# Patient Record
Sex: Male | Born: 1945 | Race: Black or African American | Hispanic: No | Marital: Married | Smoking: Former smoker
Health system: Southern US, Community
[De-identification: ages and names within clinical notes are randomized; demographics above are authoritative.]

## PROBLEM LIST (undated history)

## (undated) DIAGNOSIS — E119 Type 2 diabetes mellitus without complications: Secondary | ICD-10-CM

## (undated) DIAGNOSIS — E7800 Pure hypercholesterolemia, unspecified: Secondary | ICD-10-CM

## (undated) DIAGNOSIS — I10 Essential (primary) hypertension: Secondary | ICD-10-CM

## (undated) DIAGNOSIS — N289 Disorder of kidney and ureter, unspecified: Secondary | ICD-10-CM

## (undated) HISTORY — PX: PENILE PROSTHESIS IMPLANT: SHX240

## (undated) HISTORY — PX: TONSILLECTOMY: SUR1361

---

## 2015-09-25 ENCOUNTER — Encounter (HOSPITAL_COMMUNITY): Payer: Self-pay

## 2015-09-25 ENCOUNTER — Emergency Department (HOSPITAL_COMMUNITY)
Admission: EM | Admit: 2015-09-25 | Discharge: 2015-09-25 | Disposition: A | Discharge status: Home / Self Care | Payer: Medicare Other | Attending: Emergency Medicine | Admitting: Emergency Medicine

## 2015-09-25 DIAGNOSIS — D649 Anemia, unspecified: Secondary | ICD-10-CM | POA: Insufficient documentation

## 2015-09-25 DIAGNOSIS — R300 Dysuria: Secondary | ICD-10-CM | POA: Diagnosis present

## 2015-09-25 DIAGNOSIS — E119 Type 2 diabetes mellitus without complications: Secondary | ICD-10-CM | POA: Insufficient documentation

## 2015-09-25 DIAGNOSIS — I120 Hypertensive chronic kidney disease with stage 5 chronic kidney disease or end stage renal disease: Secondary | ICD-10-CM | POA: Diagnosis not present

## 2015-09-25 DIAGNOSIS — N186 End stage renal disease: Secondary | ICD-10-CM | POA: Insufficient documentation

## 2015-09-25 DIAGNOSIS — Z992 Dependence on renal dialysis: Secondary | ICD-10-CM | POA: Diagnosis not present

## 2015-09-25 HISTORY — DX: Essential (primary) hypertension: I10

## 2015-09-25 HISTORY — DX: Type 2 diabetes mellitus without complications: E11.9

## 2015-09-25 HISTORY — DX: Pure hypercholesterolemia, unspecified: E78.00

## 2015-09-25 HISTORY — DX: Disorder of kidney and ureter, unspecified: N28.9

## 2015-09-25 LAB — URINALYSIS, ROUTINE W REFLEX MICROSCOPIC
Bilirubin Urine: NEGATIVE
GLUCOSE, UA: NEGATIVE mg/dL
KETONES UR: NEGATIVE mg/dL
LEUKOCYTES UA: NEGATIVE
NITRITE: NEGATIVE
Protein, ur: >300 mg/dL — AB
Specific Gravity, Urine: 1.022 (ref 1.005–1.030)
pH: 5.5 (ref 5.0–8.0)

## 2015-09-25 LAB — CBC
HEMATOCRIT: 31.8 % — AB (ref 39.0–52.0)
HEMOGLOBIN: 10.4 g/dL — AB (ref 13.0–17.0)
MCH: 28 pg (ref 26.0–34.0)
MCHC: 32.7 g/dL (ref 30.0–36.0)
MCV: 85.5 fL (ref 78.0–100.0)
Platelets: 288 10*3/uL (ref 150–400)
RBC: 3.72 MIL/uL — ABNORMAL LOW (ref 4.22–5.81)
RDW: 13 % (ref 11.5–15.5)
WBC: 6.9 10*3/uL (ref 4.0–10.5)

## 2015-09-25 LAB — BASIC METABOLIC PANEL
ANION GAP: 14 (ref 5–15)
BUN: 68 mg/dL — ABNORMAL HIGH (ref 6–20)
CALCIUM: 9.5 mg/dL (ref 8.9–10.3)
CO2: 23 mmol/L (ref 22–32)
CREATININE: 7.95 mg/dL — AB (ref 0.61–1.24)
Chloride: 99 mmol/L — ABNORMAL LOW (ref 101–111)
GFR, EST AFRICAN AMERICAN: 7 mL/min — AB (ref >60)
GFR, EST NON AFRICAN AMERICAN: 6 mL/min — AB (ref >60)
Glucose, Bld: 259 mg/dL — ABNORMAL HIGH (ref 65–99)
Potassium: 4.3 mmol/L (ref 3.5–5.1)
SODIUM: 136 mmol/L (ref 135–145)

## 2015-09-25 LAB — URINE MICROSCOPIC-ADD ON

## 2015-09-25 NOTE — ED Notes (Signed)
Patient here with ongoing dysuria for the past week, has taken full week of Cipro with no relief, no other associated symptoms

## 2015-09-25 NOTE — Discharge Instructions (Signed)
Return to the ED with any concerns including fever/chills, vomiting and not able to keep down liquids, not able to urinate, fainting, decreased level of alertness/lethargy, or any other alarming symptoms

## 2015-09-25 NOTE — ED Notes (Signed)
Bladder scanned pt, maximum result: 165 mL, informed Carla-RN.

## 2015-09-25 NOTE — ED Provider Notes (Signed)
CSN: [id number]     Arrival date & time 09/25/15  1237 History   First MD Initiated Contact with Patient 09/25/15 1639     Chief Complaint  Patient presents with  . Dysuria     (Consider location/radiation/quality/duration/timing/severity/associated sxs/prior Treatment) HPI  Pt presenting with c/o dysuria.  He describes pain with urinating, as well as pain in his lower abdomen.  He states the pain improves after his urine stream starts.  He is from [place]- was started on cipro one week ago for presumed urine infection- he states he finished the medication for week course and has not seen much improvement in the pain with urination.  No fever/chills.  Does not feel it is difficult to pass urine.  No blood in urine.  No flank pain.  Feels like a spasm of his bladder when he urinates.  No penile discharge.  Wife also states he has been more tired than usual.  They are traveling currently.  There are no other associated systemic symptoms, there are no other alleviating or modifying factors.   Past Medical History  Diagnosis Date  . Diabetes mellitus without complication (HCC)   . Hypertension   . Renal disorder   . High cholesterol    History reviewed. No pertinent past surgical history. No family history on file. Social History  Substance Use Topics  . Smoking status: Never Smoker   . Smokeless tobacco: None  . Alcohol Use: None    Review of Systems  ROS reviewed and all otherwise negative except for mentioned in HPI    Allergies  Aleve; Allopurinol; Clonidine derivatives; and Tylenol with codeine #3  Home Medications   Prior to Admission medications   Not on File   BP 153/70 mmHg  Pulse 71  Temp(Src) 99.2 F (37.3 C) (Oral)  Resp 22  Ht  (1.803 m)  Wt 100.699 kg  BMI 30.98 kg/m2  SpO2 100%  Vitals reviewed Physical Exam  Physical Examination: General appearance - alert, well appearing, and in no distress Mental status - alert, oriented to person, place, and  time Eyes - no conjunctival injection no scleral icterus Chest - clear to auscultation, no wheezes, rales or rhonchi, symmetric air entry Heart - normal rate, regular rhythm, normal S1, S2, no murmurs, rubs, clicks or gallops Abdomen - soft, nontender, nondistended, no masses or organomegaly Neurological - alert, oriented, normal speech Extremities - peripheral pulses normal, no pedal edema, no clubbing or cyanosis Skin - normal coloration and turgor, no rashes  ED Course  Procedures (including critical care time) Labs Review Labs Reviewed  URINALYSIS, ROUTINE W REFLEX MICROSCOPIC (NOT AT [facility]) - Abnormal; Notable for the following:    Hgb urine dipstick MODERATE (*)    Protein, ur >300 (*)    All other components within normal limits  URINE MICROSCOPIC-ADD ON - Abnormal; Notable for the following:    Squamous Epithelial / LPF 0-5 (*)    Bacteria, UA FEW (*)    Casts GRANULAR CAST (*)    All other components within normal limits  CBC - Abnormal; Notable for the following:    RBC 3.72 (*)    Hemoglobin 10.4 (*)    HCT 31.8 (*)    All other components within normal limits  BASIC METABOLIC PANEL - Abnormal; Notable for the following:    Chloride 99 (*)    Glucose, Bld 259 (*)    BUN 68 (*)    Creatinine, Ser 7.95 (*)    GFR calc  non Af Amer 6 (*)    GFR calc Af Amer 7 (*)    All other components within normal limits    Imaging Review No results found. I have personally reviewed and evaluated these images and lab results as part of my medical decision-making.   EKG Interpretation None      MDM   Final diagnoses:  Dysuria  End stage renal disease on dialysis (HCC)  Anemia, unspecified anemia type    Pt presenting with c/o dysuria for the past week- has finished a course of cipro- urine is reassuring today in the ED.  Pt is on peritoneal dialysis- labs are reassuring and near his baseline.  Pt has a hx of anemia.  Bladder scan performed and does not show signs of  urinary retention.  Discharged with strict return precautions.  Pt agreeable with plan.    Jerelyn ScottMartha Linker, MD 09/25/15 [phone]

## 2015-09-26 ENCOUNTER — Encounter (HOSPITAL_COMMUNITY): Payer: Self-pay | Admitting: *Deleted

## 2015-09-26 ENCOUNTER — Ambulatory Visit (HOSPITAL_COMMUNITY)
Admission: EM | Admit: 2015-09-26 | Discharge: 2015-09-26 | Disposition: A | Discharge status: Home / Self Care | Payer: Medicare Other | Attending: Family Medicine | Admitting: Family Medicine

## 2015-09-26 DIAGNOSIS — I10 Essential (primary) hypertension: Secondary | ICD-10-CM | POA: Insufficient documentation

## 2015-09-26 DIAGNOSIS — E7800 Pure hypercholesterolemia, unspecified: Secondary | ICD-10-CM | POA: Diagnosis not present

## 2015-09-26 DIAGNOSIS — R300 Dysuria: Secondary | ICD-10-CM | POA: Insufficient documentation

## 2015-09-26 DIAGNOSIS — Z8249 Family history of ischemic heart disease and other diseases of the circulatory system: Secondary | ICD-10-CM | POA: Diagnosis not present

## 2015-09-26 DIAGNOSIS — E119 Type 2 diabetes mellitus without complications: Secondary | ICD-10-CM | POA: Diagnosis not present

## 2015-09-26 DIAGNOSIS — R109 Unspecified abdominal pain: Secondary | ICD-10-CM | POA: Diagnosis present

## 2015-09-26 MED ORDER — PHENAZOPYRIDINE HCL 200 MG PO TABS: 200.0000 mg | ORAL_TABLET | Freq: Three times a day (TID) | ORAL | Status: AC | PRN

## 2015-09-26 NOTE — ED Notes (Signed)
Pt  Has  Peritoneal  Dialysis       He  Was    [facility]  [facility]    [facility]       He   Recently        Completed   A  Course  On  Anti  Biotics            he  States  Unable  To  See  A  Urologist  Until  Next month

## 2015-09-26 NOTE — ED Provider Notes (Signed)
CSN: [id number]     Arrival date & time 09/26/15  1522 History   First MD Initiated Contact with Patient 09/26/15 1551     Chief Complaint  Patient presents with  . Abdominal Pain   (Consider location/radiation/quality/duration/timing/severity/associated sxs/prior Treatment) HPI History obtained from patient:  Pt presents with the cc of:  Urethral pain, dysuria Duration of symptoms: Almost 2 weeks Treatment prior to arrival: Currently taking ciprofloxacin for UTI, was seen in the emergency department yesterday no infection but no medication for spasm. Context: Patient is visiting the area from [facility], was being treated for urinary tract infection with ciprofloxacin. He has had dysuria most of the days that he has had an infection. He has not had any relief from increased fluids. Denies any fever at this time. Denies any blood in his urine. Other symptoms include: No back pain. Pain score: 3 but only when about to urinate. Once flow starts dysuria is resolved until the next time. FAMILY HISTORY: Hypertension    Past Medical History  Diagnosis Date  . Diabetes mellitus without complication (HCC)   . Hypertension   . Renal disorder   . High cholesterol    History reviewed. No pertinent past surgical history. History reviewed. No pertinent family history. Social History  Substance Use Topics  . Smoking status: Never Smoker   . Smokeless tobacco: None  . Alcohol Use: None    Review of Systems  Denies: HEADACHE, NAUSEA, ABDOMINAL PAIN, CHEST PAIN, CONGESTION,   SHORTNESS OF BREATH  Allergies  Aleve; Allopurinol; Clonidine derivatives; and Tylenol with codeine #3  Home Medications   Prior to Admission medications   Medication Sig Start Date End Date Taking? Authorizing Provider  phenazopyridine (PYRIDIUM) 200 MG tablet Take 1 tablet (200 mg total) by mouth 3 (three) times daily as needed for pain. 09/26/15   Tharon Aquas, PA   Meds Ordered and  Administered this Visit  Medications - No data to display  BP 149/67 mmHg  Pulse 79  Temp(Src) 97.8 F (36.6 C) (Oral)  Resp 16  SpO2 100% No data found.   Physical Exam NURSES NOTES AND VITAL SIGNS REVIEWED. CONSTITUTIONAL: Well developed, well nourished, no acute distress HEENT: normocephalic, atraumatic EYES: Conjunctiva normal NECK:normal ROM, supple, no adenopathy PULMONARY:No respiratory distress, normal effort ABDOMINAL: Soft, ND, NT BS+, No CVAT MUSCULOSKELETAL: Normal ROM of all extremities,  SKIN: warm and dry without rash PSYCHIATRIC: Mood and affect, behavior are normal  ED Course  Procedures (including critical care time)  Labs Review Labs Reviewed  URINE CULTURE    Imaging Review No results found.   Visual Acuity Review  Right Eye Distance:   Left Eye Distance:   Bilateral Distance:    Right Eye Near:   Left Eye Near:    Bilateral Near:         MDM   1. Dysuria     Patient is reassured that there are no issues that require transfer to higher level of care at this time or additional tests. Patient is advised to continue home symptomatic treatment. Patient is advised that if there are new or worsening symptoms to attend the emergency department, contact primary care provider, or return to UC. Instructions of care provided discharged home in stable condition.    THIS NOTE WAS GENERATED USING A VOICE RECOGNITION SOFTWARE PROGRAM. ALL REASONABLE EFFORTS  WERE MADE TO PROOFREAD THIS DOCUMENT FOR ACCURACY.  I have verbally reviewed the discharge instructions with the patient. A printed AVS was  given to the patient.  All questions were answered prior to discharge.      Tharon AquasFrank C Akai Dollard, PA 09/26/15 [phone]

## 2015-09-26 NOTE — Discharge Instructions (Signed)

## 2015-09-27 LAB — URINE CULTURE

## 2015-09-28 ENCOUNTER — Encounter (HOSPITAL_COMMUNITY): Payer: Self-pay | Admitting: Emergency Medicine

## 2015-09-28 ENCOUNTER — Ambulatory Visit (INDEPENDENT_AMBULATORY_CARE_PROVIDER_SITE_OTHER): Payer: Medicare Other

## 2015-09-28 ENCOUNTER — Ambulatory Visit (HOSPITAL_COMMUNITY)
Admission: EM | Admit: 2015-09-28 | Discharge: 2015-09-28 | Disposition: A | Discharge status: Home / Self Care | Payer: Medicare Other | Attending: Internal Medicine | Admitting: Internal Medicine

## 2015-09-28 DIAGNOSIS — R300 Dysuria: Secondary | ICD-10-CM

## 2015-09-28 LAB — POCT URINALYSIS DIP (DEVICE)
GLUCOSE, UA: 250 mg/dL — AB
KETONES UR: NEGATIVE mg/dL
Leukocytes, UA: NEGATIVE
Nitrite: POSITIVE — AB
SPECIFIC GRAVITY, URINE: 1.02 (ref 1.005–1.030)
Urobilinogen, UA: 1 mg/dL (ref 0.0–1.0)
pH: 6 (ref 5.0–8.0)

## 2015-09-28 NOTE — ED Notes (Addendum)
PT was treated for a UTI 9 days ago based on symptoms, they did not check his urine. [facility] and had a bladder scan a clear urinalysis and was discharged without meds. [facility] and had a clear urinalysis and was given pyridium. PT is on peritoneal dialysis at night. Dialysate has been clear. PT reports continued dysuria and lower/mid bilateral abdominal pain. PT reports intermittent vomiting throughout this episode.

## 2015-09-28 NOTE — ED Provider Notes (Signed)
CSN: [id number]     Arrival date & time 09/28/15  1030 History   First MD Initiated Contact with Patient 09/28/15 1150     Chief Complaint  Patient presents with  . Dysuria  . 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 Several recent presentations for dysuria, now with some abdominal discomfort. Getting ready to go on a cruise, and doesn't want to be sick on the boat. Multiple recent urinalyses have been benign; no urinary retention found on ultrasound in the emergency room the other night. Has been taking Cipro. History of chronic constipation, takes MiraLAX, and is having daily bowel movements. Reports emesis after breakfast for the last 2 days, which has not persisted during the day. No runny/congested nose, not coughing anymore than usual.  Has had some urinary discomfort for the last 3 weeks.  Urinary discomfort has not improved with Cipro and Pyridium; chronically takes Flomax. History of penile implant. Urology follow-up is pending.  Past Medical History  Diagnosis Date  . Diabetes mellitus without complication (HCC)   . Hypertension   . Renal disorder   . High cholesterol    Past Surgical History  Procedure Laterality Date  . Penile prosthesis implant    . Tonsillectomy     No family history on file. Social History  Substance Use Topics  . Smoking status: Former Smoker    Quit date: 09/14/1973  . Smokeless tobacco: None  . Alcohol Use: No    Review of Systems  All other systems reviewed and are negative.   Allergies  Aleve; Allopurinol; Clonidine derivatives; and Tylenol with codeine #3  Home Medications   Prior to Admission medications   Medication Sig Start Date End Date Taking? Authorizing Provider  phenazopyridine (PYRIDIUM) 200 MG tablet Take 1 tablet (200 mg total) by mouth 3 (three) times daily as needed for pain. 09/26/15   Tharon AquasFrank C Patrick,  PA  Recent course of ciprofloxacin.    BP 116/54 mmHg  Pulse 68  Temp(Src) 98.2 F (36.8 C) (Temporal)  Resp 16  SpO2 98% Physical Exam  Constitutional: He is oriented to person, place, and time. No distress.  Alert, nicely groomed  HENT:  Head: Atraumatic.  Eyes:  Wearing sunglasses  Neck: Neck supple.  Cardiovascular: Normal rate and regular rhythm.   Pulmonary/Chest: No respiratory distress.  Slightly coarse but symmetric breath sounds throughout  Abdominal: He exhibits no distension.  Mild diffuse tenderness to deep palpation throughout. Patient describes abdominal pain as episodic sharp/brief discomfort, last a few seconds. Dialysate has been clear; wife has been caring for the catheter site for a number of years, and describes it as without redness/swelling or other worrisome signs.  Musculoskeletal: Normal range of motion.  Neurological: He is alert and oriented to person, place, and time.  Skin: Skin is warm and dry.  No cyanosis  Nursing note and vitals reviewed.   ED Course  Procedures (including critical care time)  Results for orders placed or performed during the hospital encounter of 09/28/15  POCT urinalysis dip (device)  Result Value Ref Range   Glucose, UA 250 (A) NEGATIVE mg/dL   Bilirubin Urine SMALL (A) NEGATIVE   Ketones, ur NEGATIVE NEGATIVE mg/dL   Specific Gravity, Urine 1.020 1.005 - 1.030   Hgb urine dipstick MODERATE (A) NEGATIVE   pH 6.0 5.0 - 8.0   Protein, ur >=300 (A) NEGATIVE mg/dL   Urobilinogen,  UA 1.0 0.0 - 1.0 mg/dL   Nitrite POSITIVE (A) NEGATIVE   Leukocytes, UA NEGATIVE NEGATIVE   EXAM: DG ABDOMEN ACUTE W/ 1V CHEST  COMPARISON: None.  FINDINGS: There is no evidence of dilated bowel loops or free intraperitoneal air. No radiopaque calculi or other significant radiographic abnormality is seen. There is a peritoneal dialysis catheter present.  There is mild bilateral interstitial thickening, likely chronic. There is a  trace right pleural effusion. There is no focal parenchymal opacity. There is no pneumothorax. The heart and mediastinal contours are unremarkable.  The osseous structures are unremarkable.  IMPRESSION: Negative abdominal radiographs. No acute cardiopulmonary disease.   Electronically Signed  By: Elige Ko  On: 09/28/2015 12:45  MDM   1. Dysuria    Recent UAs/cultures benign but was taking cipro at the times samples were obtained. Had ultrasound of bladder in ED that did not suggest obstruction/retention.   Abd/chest xray at [facility] today benign. No immediate threat to well being identified.  Has plan to followup with urologist about dysuria.  Urine culture ordered.    Eustace Moore, MD 09/29/15 1226

## 2015-09-28 NOTE — Discharge Instructions (Signed)
Urine culture is pending; result should be available in about 48 hours.  Dysuria Dysuria is pain or discomfort while urinating. The pain or discomfort may be felt in the tube that carries urine out of the bladder (urethra) or in the surrounding tissue of the genitals. The pain may also be felt in the groin area, lower abdomen, and lower back. You may have to urinate frequently or have the sudden feeling that you have to urinate (urgency). Dysuria can affect both men and women, but is more common in women. Dysuria can be caused by many different things, including:  Urinary tract infection in women.  Infection of the kidney or bladder.  Kidney stones or bladder stones.  Certain sexually transmitted infections (STIs), such as chlamydia.  Dehydration.  Inflammation of the vagina.  Use of certain medicines.  Use of certain soaps or scented products that cause irritation. HOME CARE INSTRUCTIONS Watch your dysuria for any changes. The following actions may help to reduce any discomfort you are feeling:  Drink enough fluid to keep your urine clear or pale yellow.  Empty your bladder often. Avoid holding urine for long periods of time.  After a bowel movement or urination, women should cleanse from front to back, using each tissue only once.  Empty your bladder after sexual intercourse.  Take medicines only as directed by your health care provider.  If you were prescribed an antibiotic medicine, finish it all even if you start to feel better.  Avoid caffeine, tea, and alcohol. They can irritate the bladder and make dysuria worse. In men, alcohol may irritate the prostate.  Keep all follow-up visits as directed by your health care provider. This is important.  If you had any tests done to find the cause of dysuria, it is your responsibility to obtain your test results. Ask the lab or department performing the test when and how you will get your results. Talk with your health care  provider if you have any questions about your results. SEEK MEDICAL CARE IF:  You develop pain in your back or sides.  You have a fever.  You have nausea or vomiting.  You have blood in your urine.  You are not urinating as often as you usually do. SEEK IMMEDIATE MEDICAL CARE IF:  You pain is severe and not relieved with medicines.  You are unable to hold down any fluids.  You or someone else notices a change in your mental function.  You have a rapid heartbeat at rest.  You have shaking or chills.  You feel extremely weak.   This information is not intended to replace advice given to you by your health care provider. Make sure you discuss any questions you have with your health care provider.   Document Released: 11/30/2003 Document Revised: 03/24/2014 Document Reviewed: 10/27/2013 Elsevier Interactive Patient Education [employer]2016 Elsevier Inc.

## 2017-10-19 ENCOUNTER — Inpatient Hospital Stay
Admit: 2017-10-19 | Discharge: 2017-10-19 | Disposition: A | Discharge status: Home / Self Care | Payer: MEDICARE | Attending: Personal Emergency Response Attendant

## 2017-10-19 DIAGNOSIS — N189 Chronic kidney disease, unspecified: Secondary | ICD-10-CM

## 2017-10-19 LAB — METABOLIC PANEL, BASIC
Anion gap: 17 mmol/L — ABNORMAL HIGH (ref 6–15)
BUN/Creatinine ratio: 7 (ref 7–25)
BUN: 89 MG/DL — ABNORMAL HIGH (ref 7–18)
CO2: 23 mmol/L (ref 21–32)
Calcium: 9.1 MG/DL (ref 8.5–10.1)
Chloride: 100 mmol/L (ref 98–107)
Creatinine: 13.6 MG/DL — CR (ref 0.60–1.30)
GFR est AA: 4 mL/min/{1.73_m2} — ABNORMAL LOW (ref >60)
GFR est non-AA: 4 mL/min/{1.73_m2} — ABNORMAL LOW (ref >60)
Glucose: 113 mg/dL — ABNORMAL HIGH (ref 70–110)
Potassium: 5.5 mmol/L — ABNORMAL HIGH (ref 3.5–5.3)
Sodium: 140 mmol/L (ref 136–145)

## 2017-10-19 LAB — CBC WITH AUTOMATED DIFF
ABS. BASOPHILS: 0 10*3/uL (ref 0.0–0.1)
ABS. EOSINOPHILS: 0.2 10*3/uL (ref 0.0–0.5)
ABS. IMM. GRANS.: 0 10*3/uL
ABS. LYMPHOCYTES: 0.7 10*3/uL — ABNORMAL LOW (ref 0.8–3.5)
ABS. MONOCYTES: 0.4 10*3/uL — ABNORMAL LOW (ref 0.8–3.5)
ABS. NEUTROPHILS: 2.1 10*3/uL (ref 1.5–8.0)
BASOPHILS: 1 % (ref 0–2)
EOSINOPHILS: 6 % — ABNORMAL HIGH (ref 0–5)
HCT: 32.4 % — ABNORMAL LOW (ref 41–53)
HGB: 10.2 g/dL — ABNORMAL LOW (ref 13.5–17.5)
IMMATURE GRANULOCYTES: 0 % — ABNORMAL LOW (ref 2–10)
LYMPHOCYTES: 21 % (ref 19–48)
MCH: 30.4 PG (ref 27–31)
MCHC: 31.5 g/dL (ref 31–37)
MCV: 96.7 FL (ref 80–100)
MONOCYTES: 12 % — ABNORMAL HIGH (ref 3–9)
MPV: 9.5 FL (ref 5.9–10.3)
NEUTROPHILS: 60 % (ref 40–74)
PLATELET: 221 10*3/uL (ref 130–400)
RBC: 3.35 M/uL — ABNORMAL LOW (ref 4.7–6.1)
RDW: 14.3 % (ref 11.5–14.5)
WBC: 3.4 10*3/uL — ABNORMAL LOW (ref 4.5–10.8)

## 2017-10-19 LAB — PHOSPHORUS
Phosphorus: 6.8 MG/DL — ABNORMAL HIGH (ref 2.5–4.9)
Phosphorus: 6.8 MG/DL — ABNORMAL HIGH (ref 2.5–4.9)

## 2017-10-19 LAB — MAGNESIUM
Magnesium: 3.4 mg/dL — ABNORMAL HIGH (ref 1.8–2.4)
Magnesium: 3.4 mg/dL — ABNORMAL HIGH (ref 1.8–2.4)

## 2017-10-19 LAB — CBC WITH AUTO DIFFERENTIAL
Basophils %: 1 % (ref 0–2)
Basophils Absolute: 0 10*3/uL (ref 0.0–0.1)
Eosinophils %: 6 % — ABNORMAL HIGH (ref 0–5)
Eosinophils Absolute: 0.2 10*3/uL (ref 0.0–0.5)
Granulocyte Absolute Count: 0 10*3/uL
Hematocrit: 32.4 % — ABNORMAL LOW (ref 41–53)
Hemoglobin: 10.2 g/dL — ABNORMAL LOW (ref 13.5–17.5)
Immature Granulocytes: 0 % — ABNORMAL LOW (ref 2–10)
Lymphocytes %: 21 % (ref 19–48)
Lymphocytes Absolute: 0.7 10*3/uL — ABNORMAL LOW (ref 0.8–3.5)
MCH: 30.4 PG (ref 27–31)
MCHC: 31.5 g/dL (ref 31–37)
MCV: 96.7 FL (ref 80–100)
MPV: 9.5 FL (ref 5.9–10.3)
Monocytes %: 12 % — ABNORMAL HIGH (ref 3–9)
Monocytes Absolute: 0.4 10*3/uL — ABNORMAL LOW (ref 0.8–3.5)
Neutrophils %: 60 % (ref 40–74)
Neutrophils Absolute: 2.1 10*3/uL (ref 1.5–8.0)
Platelets: 221 10*3/uL (ref 130–400)
RBC: 3.35 M/uL — ABNORMAL LOW (ref 4.7–6.1)
RDW: 14.3 % (ref 11.5–14.5)
WBC: 3.4 10*3/uL — ABNORMAL LOW (ref 4.5–10.8)

## 2017-10-19 LAB — BASIC METABOLIC PANEL
Anion Gap: 17 mmol/L — ABNORMAL HIGH (ref 6–15)
BUN: 89 MG/DL — ABNORMAL HIGH (ref 7–18)
Bun/Cre Ratio: 7 (ref 7–25)
CO2: 23 mmol/L (ref 21–32)
Calcium: 9.1 MG/DL (ref 8.5–10.1)
Chloride: 100 mmol/L (ref 98–107)
Creatinine: 13.6 MG/DL (ref 0.60–1.30)
EGFR IF NonAfrican American: 4 mL/min/{1.73_m2} — ABNORMAL LOW (ref >60)
GFR African American: 4 mL/min/{1.73_m2} — ABNORMAL LOW (ref >60)
Glucose: 113 mg/dL — ABNORMAL HIGH (ref 70–110)
Potassium: 5.5 mmol/L — ABNORMAL HIGH (ref 3.5–5.3)
Sodium: 140 mmol/L (ref 136–145)

## 2017-10-19 NOTE — ED Notes (Signed)
Dr Vanhoose notified that pts wife was able to get him an appt for dialysis in [place] around 1600 today.

## 2017-10-19 NOTE — ED Provider Notes (Addendum)
Pt from out of town and due for dialysis today, got it Friday, no acute complaints, his automobile broke down      Other   This is a new problem. Episode onset: today. The problem has not changed since onset.Pertinent negatives include no chest pain, no abdominal pain, no headaches and no shortness of breath. Nothing aggravates the symptoms. Nothing relieves the symptoms. He has tried nothing for the symptoms.        Past Medical History:   Diagnosis Date   ??? Arthritis    ??? Asthma    ??? Chronic kidney disease        Past Surgical History:   Procedure Laterality Date   ??? HX HEENT      Tonsils   ??? VASCULAR SURGERY PROCEDURE UNLIST      Fistula Right Upper Arm         History reviewed. No pertinent family history.    Social History     Socioeconomic History   ??? Marital status: MARRIED     Spouse name: Not on file   ??? Number of children: Not on file   ??? Years of education: Not on file   ??? Highest education level: Not on file   Occupational History   ??? Not on file   Social Needs   ??? Financial resource strain: Not on file   ??? Food insecurity:     Worry: Not on file     Inability: Not on file   ??? Transportation needs:     Medical: Not on file     Non-medical: Not on file   Tobacco Use   ??? Smoking status: Never Smoker   ??? Smokeless tobacco: Never Used   Substance and Sexual Activity   ??? Alcohol use: Never     Frequency: Never   ??? Drug use: Never   ??? Sexual activity: Yes     Partners: Male   Lifestyle   ??? Physical activity:     Days per week: Not on file     Minutes per session: Not on file   ??? Stress: Not on file   Relationships   ??? Social connections:     Talks on phone: Not on file     Gets together: Not on file     Attends religious service: Not on file     Active member of club or organization: Not on file     Attends meetings of clubs or organizations: Not on file     Relationship status: Not on file   ??? Intimate partner violence:     Fear of current or ex partner: Not on file     Emotionally abused: Not on file      Physically abused: Not on file     Forced sexual activity: Not on file   Other Topics Concern   ??? Not on file   Social History Narrative   ??? Not on file         ALLERGIES: Allopurinol    Review of Systems   Constitutional: Negative for fever.   HENT: Negative for sore throat.    Respiratory: Negative for shortness of breath.    Cardiovascular: Negative for chest pain and leg swelling.   Gastrointestinal: Negative for abdominal pain and vomiting.   Genitourinary: Negative for flank pain.   Skin: Negative for rash.   Neurological: Negative for headaches.   Psychiatric/Behavioral: Negative for confusion.       Vitals:      10/19/17 1152   BP: 137/71   Pulse: 65   Resp: 19   SpO2: 97%   Weight: 91.6 kg (202 lb)   Height: 5' 11" (1.803 m)            Physical Exam   Constitutional: No distress.   HENT:   Head: Normocephalic and atraumatic.   Mouth/Throat: Oropharynx is clear and moist. No oropharyngeal exudate.   Neck: Neck supple.   Cardiovascular: Normal rate, regular rhythm and intact distal pulses. Exam reveals no gallop and no friction rub.   No murmur heard.  Pulmonary/Chest: Effort normal and breath sounds normal. No stridor.   Abdominal: Soft. Bowel sounds are normal. He exhibits no distension and no mass. There is no tenderness. There is no rebound and no guarding.   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   Pt has palpable thrill at R arm shunt   Lymphadenopathy:     He has no cervical adenopathy.   Neurological: He is alert.   Skin: No rash noted. He is not diaphoretic.   Psychiatric: He has a normal mood and affect.   Nursing note and vitals reviewed.       MDM  Number of Diagnoses or Management Options  Chronic renal failure, unspecified CKD stage: established and worsening         Procedures

## 2017-10-19 NOTE — ED Notes (Signed)
Pt now has an apt to get dialysis at [place] in about 2 hrs so will discharge the pt

## 2017-10-19 NOTE — ED Notes (Signed)
Signature pad not working, I have reviewed discharge instructions with the patient and spouse.  The patient and spouse verbalized understanding.

## 2017-10-19 NOTE — ED Notes (Signed)
Lab called for blood work

## 2017-10-19 NOTE — ED Triage Notes (Signed)
Pt brought to ER via [facility] for dialysis. Per pt they were visiting from [place] when [place] [address]. The patient receives dialysis MWF, last run was on Friday. Pt usually schedules dialysis at different centers while traveling, pt thought he would be home before today. Patient denies cp, sob, or pain at this time.

## 2017-10-19 NOTE — ED Notes (Signed)
Signature pad not working, I have reviewed discharge instructions with the patient and spouse.  The patient and spouse verbalized understanding.

## 2017-10-19 NOTE — ED Notes (Signed)
Pt brought to ER via [facility] for dialysis. [place] [address] The patient receives dialysis MWF, last run was on Friday. Pt usually schedules dialysis at different centers while traveling, pt thought he would be home before today. Patient denies cp, sob, or pain at this time.

## 2017-10-19 NOTE — ED Provider Notes (Signed)
Pt from out of town and due for dialysis today, got it Friday, no acute complaints, his automobile broke down      Other   This is a new problem. Episode onset: today. The problem has not changed since onset.Pertinent negatives include no chest pain, no abdominal pain, no headaches and no shortness of breath. Nothing aggravates the symptoms. Nothing relieves the symptoms. He has tried nothing for the symptoms.        Past Medical History:   Diagnosis Date   ??? Arthritis    ??? Asthma    ??? Chronic kidney disease        Past Surgical History:   Procedure Laterality Date   ??? HX HEENT      Tonsils   ??? VASCULAR SURGERY PROCEDURE UNLIST      Fistula Right Upper Arm         History reviewed. No pertinent family history.    Social History     Socioeconomic History   ??? Marital status: MARRIED     Spouse name: Not on file   ??? Number of children: Not on file   ??? Years of education: Not on file   ??? Highest education level: Not on file   Occupational History   ??? Not on file   Social Needs   ??? Financial resource strain: Not on file   ??? Food insecurity:     Worry: Not on file     Inability: Not on file   ??? Transportation needs:     Medical: Not on file     Non-medical: Not on file   Tobacco Use   ??? Smoking status: Never Smoker   ??? Smokeless tobacco: Never Used   Substance and Sexual Activity   ??? Alcohol use: Never     Frequency: Never   ??? Drug use: Never   ??? Sexual activity: Yes     Partners: Male   Lifestyle   ??? Physical activity:     Days per week: Not on file     Minutes per session: Not on file   ??? Stress: Not on file   Relationships   ??? Social connections:     Talks on phone: Not on file     Gets together: Not on file     Attends religious service: Not on file     Active member of club or organization: Not on file     Attends meetings of clubs or organizations: Not on file     Relationship status: Not on file   ??? Intimate partner violence:     Fear of current or ex partner: Not on file     Emotionally abused: Not on file      Physically abused: Not on file     Forced sexual activity: Not on file   Other Topics Concern   ??? Not on file   Social History Narrative   ??? Not on file         ALLERGIES: Allopurinol    Review of Systems   Constitutional: Negative for fever.   HENT: Negative for sore throat.    Respiratory: Negative for shortness of breath.    Cardiovascular: Negative for chest pain and leg swelling.   Gastrointestinal: Negative for abdominal pain and vomiting.   Genitourinary: Negative for flank pain.   Skin: Negative for rash.   Neurological: Negative for headaches.   Psychiatric/Behavioral: Negative for confusion.       Vitals:  10/19/17 1152   BP: 137/71   Pulse: 65   Resp: 19   SpO2: 97%   Weight: 91.6 kg (202 lb)   Height: 5\' 11"  (1.803 m)            Physical Exam   Constitutional: No distress.   HENT:   Head: Normocephalic and atraumatic.   Mouth/Throat: Oropharynx is clear and moist. No oropharyngeal exudate.   Neck: Neck supple.   Cardiovascular: Normal rate, regular rhythm and intact distal pulses. Exam reveals no gallop and no friction rub.   No murmur heard.  Pulmonary/Chest: Effort normal and breath sounds normal. No stridor.   Abdominal: Soft. Bowel sounds are normal. He exhibits no distension and no mass. There is no tenderness. There is no rebound and no guarding.   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   Pt has palpable thrill at R arm shunt   Lymphadenopathy:     He has no cervical adenopathy.   Neurological: He is alert.   Skin: No rash noted. He is not diaphoretic.   Psychiatric: He has a normal mood and affect.   Nursing note and vitals reviewed.       MDM  Number of Diagnoses or Management Options  Chronic renal failure, unspecified CKD stage: established and worsening         Procedures

## 2017-10-19 NOTE — ED Notes (Signed)
[facility] so will discharge the pt

## 2017-10-19 NOTE — ED Notes (Signed)
Dr Reita MayVanhoose notified that pts wife was able to get [place] around 1600 today.

## 2018-01-13 IMAGING — DX DG ABDOMEN ACUTE W/ 1V CHEST
4 series · 4 of 4 positions shown · non-contrast
Comparison: None.

CLINICAL DATA: Abdominal pain, history constipation

EXAM:
DG ABDOMEN ACUTE W/ 1V CHEST

[chest pa]
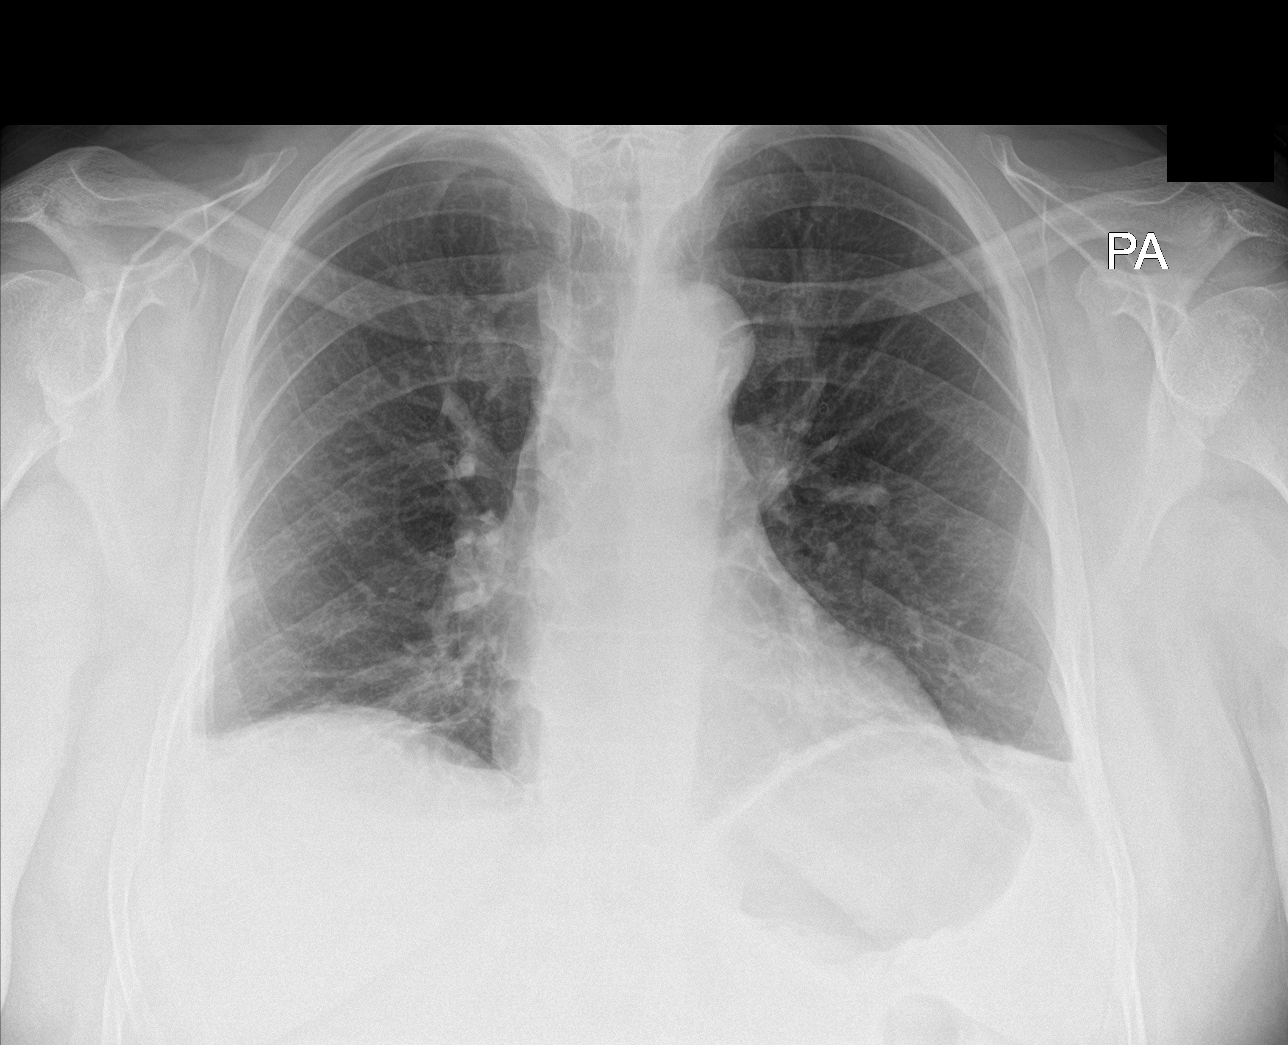

[abdomen erect]
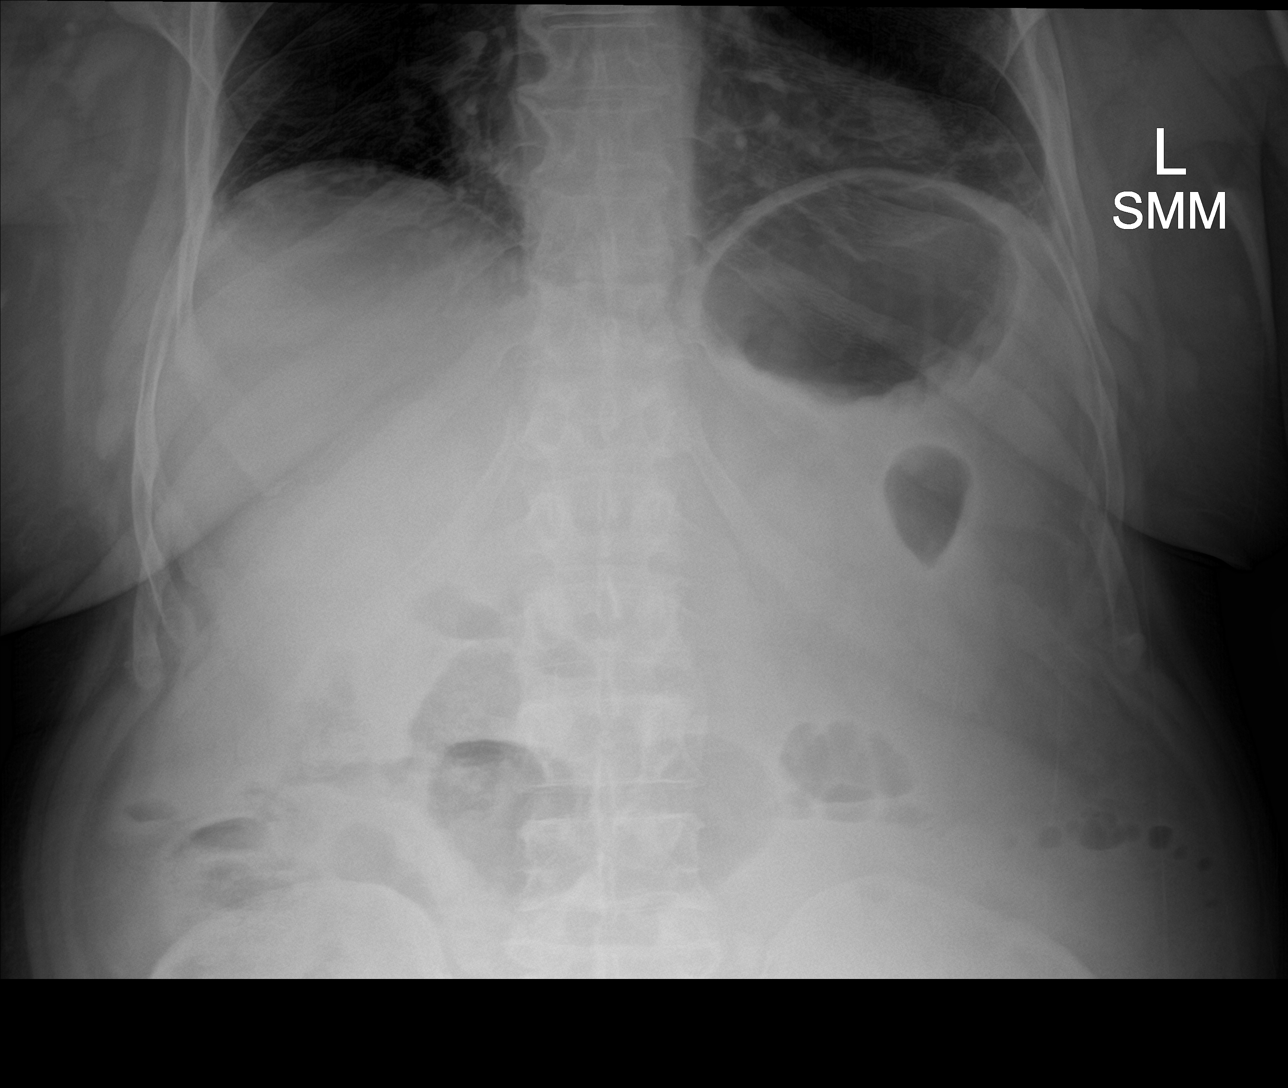

[abdomen supine (1 of 2)]
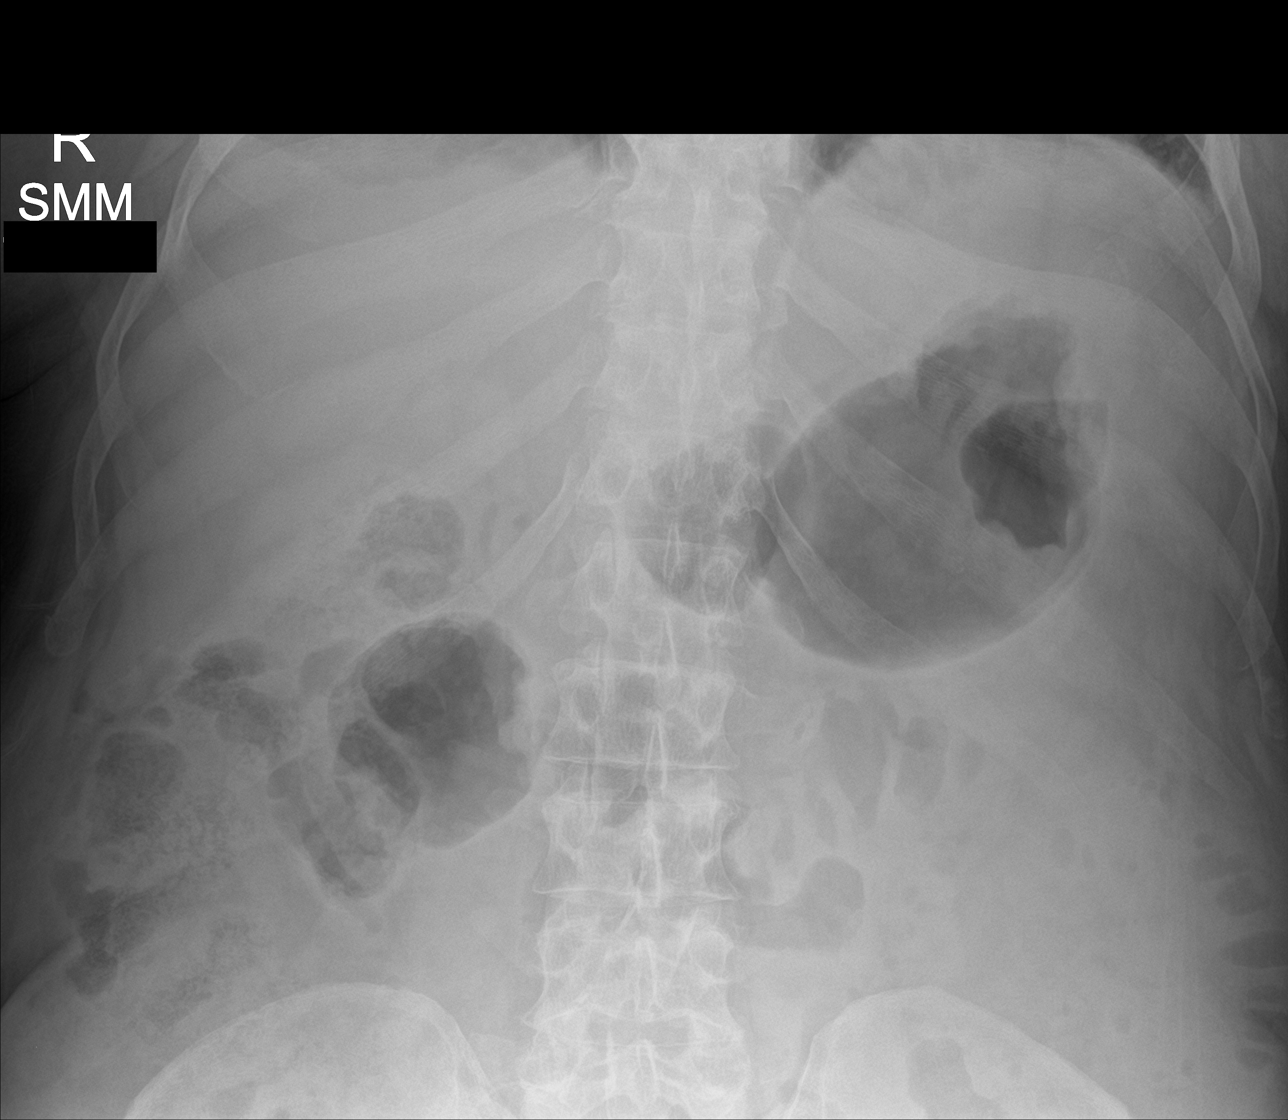

[abdomen supine (2 of 2)]
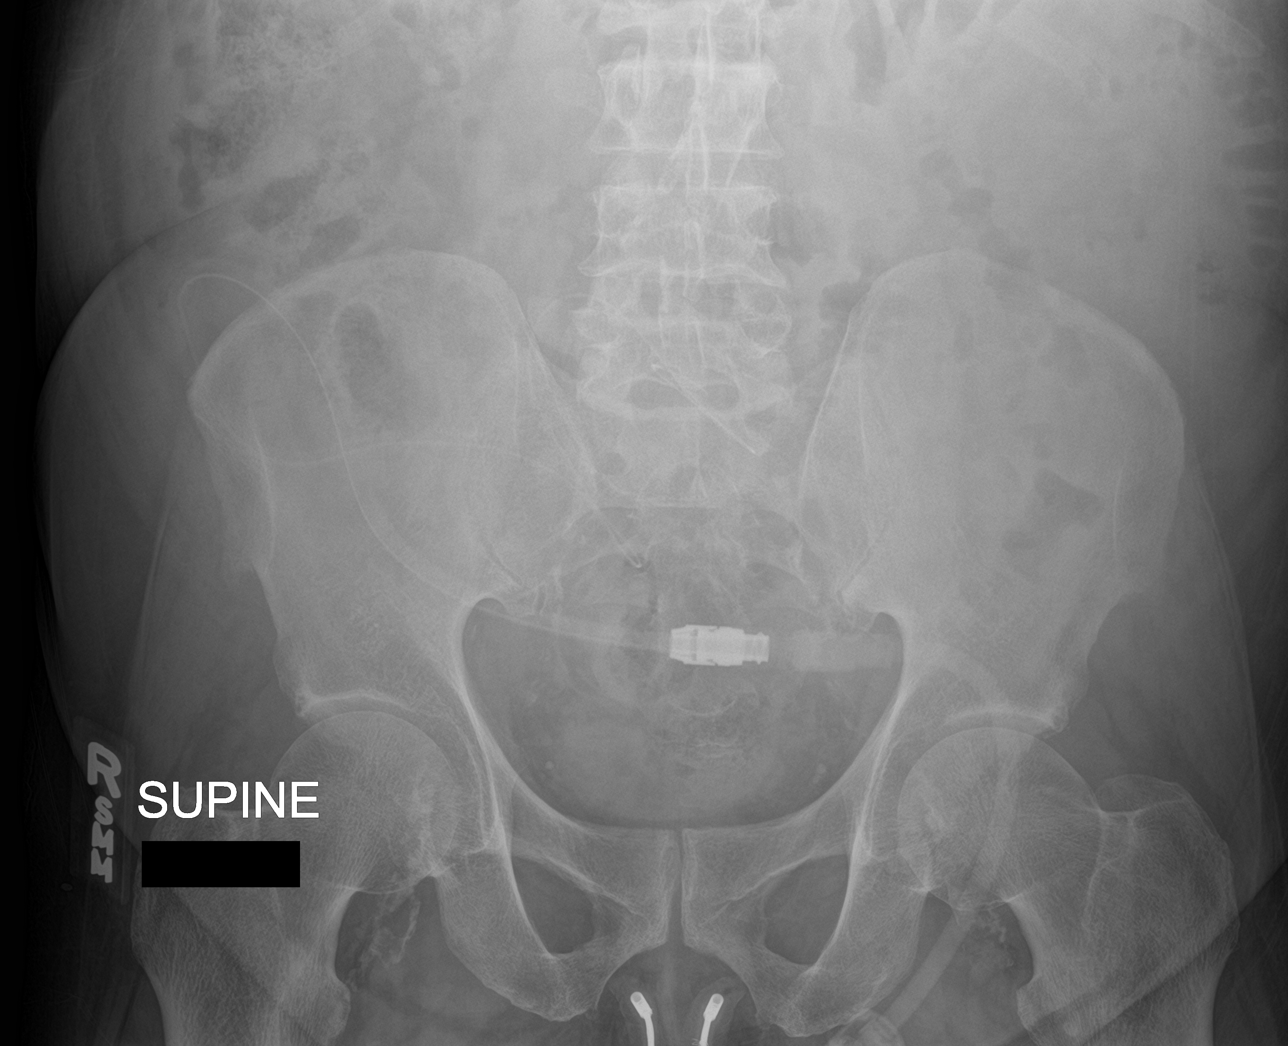

[4 of 4 positions shown; findings below may reference images not displayed]

FINDINGS: There is no evidence of dilated bowel loops or free intraperitoneal
air. No radiopaque calculi or other significant radiographic
abnormality is seen. There is a peritoneal dialysis catheter
present.

There is mild bilateral interstitial thickening, likely chronic.
There is a trace right pleural effusion. There is no focal
parenchymal opacity. There is no pneumothorax. The heart and
mediastinal contours are unremarkable.

The osseous structures are unremarkable.
IMPRESSION: Negative abdominal radiographs.  No acute cardiopulmonary disease.

## 2021-12-15 DEATH — deceased
